# Patient Record
Sex: Female | Born: 1995 | Race: White | Hispanic: Yes | Marital: Single | Smoking: Never smoker
Health system: Southern US, Community
[De-identification: ages and names within clinical notes are randomized; demographics above are authoritative.]

## PROBLEM LIST (undated history)

## (undated) DIAGNOSIS — G43909 Migraine, unspecified, not intractable, without status migrainosus: Secondary | ICD-10-CM

---

## 2015-11-15 ENCOUNTER — Encounter (HOSPITAL_COMMUNITY): Payer: Self-pay | Admitting: *Deleted

## 2015-11-15 ENCOUNTER — Emergency Department (HOSPITAL_COMMUNITY)
Admission: EM | Admit: 2015-11-15 | Discharge: 2015-11-16 | Disposition: A | Discharge status: Home / Self Care | Payer: Self-pay | Attending: Emergency Medicine | Admitting: Emergency Medicine

## 2015-11-15 DIAGNOSIS — R51 Headache: Secondary | ICD-10-CM | POA: Insufficient documentation

## 2015-11-15 DIAGNOSIS — H109 Unspecified conjunctivitis: Secondary | ICD-10-CM | POA: Insufficient documentation

## 2015-11-15 MED ORDER — TETRACAINE HCL 0.5 % OP SOLN
OPHTHALMIC | Status: AC
  Administered 2015-11-16
  Filled 2015-11-15: qty 4

## 2015-11-15 MED ORDER — FLUORESCEIN SODIUM 1 MG OP STRP
ORAL_STRIP | OPHTHALMIC | Status: AC
  Administered 2015-11-16
  Filled 2015-11-15: qty 1

## 2015-11-15 NOTE — ED Notes (Signed)
Pt reports feeling like something is in her left eye.

## 2015-11-15 NOTE — ED Provider Notes (Signed)
CSN: [id number]     Arrival date & time 11/15/15  2300 History   First MD Initiated Contact with Patient 11/15/15 2326     Chief Complaint  Patient presents with  . Eye Pain     (Consider location/radiation/quality/duration/timing/severity/associated sxs/prior Treatment) Patient is a 20 y.o. female presenting with eye pain. The history is provided by the patient.  Eye Pain This is a new problem. The current episode started today. The problem occurs intermittently. The problem has been gradually worsening. Associated symptoms include headaches. Pertinent negatives include no chills, fever, numbness, vomiting or weakness. Exacerbated by: bright lights. Treatments tried: flushing with water. The treatment provided no relief.    History reviewed. No pertinent past medical history. History reviewed. No pertinent past surgical history. No family history on file. Social History  Substance Use Topics  . Smoking status: Never Smoker   . Smokeless tobacco: None  . Alcohol Use: No   OB History    No data available     Review of Systems  Constitutional: Negative for fever and chills.  Eyes: Positive for photophobia, pain and redness.  Gastrointestinal: Negative for vomiting.  Neurological: Positive for headaches. Negative for weakness and numbness.  All other systems reviewed and are negative.     Allergies  Review of patient's allergies indicates no known allergies.  Home Medications   Prior to Admission medications   Not on File   BP 117/74 mmHg  Pulse 75  Temp(Src) 98.1 F (36.7 C) (Oral)  Resp 18  Ht 5\' 6"  (1.676 m)  Wt 72.576 kg  BMI 25.84 kg/m2  SpO2 100%  LMP 10/21/2015 Physical Exam  Constitutional: She is oriented to person, place, and time. She appears well-developed and well-nourished.  Non-toxic appearance.  HENT:  Head: Normocephalic.  Right Ear: Tympanic membrane and external ear normal.  Left Ear: Tympanic membrane and external ear normal.  Eyes: EOM  and lids are normal. Pupils are equal, round, and reactive to light. Right eye exhibits no chemosis, no discharge and no hordeolum. No foreign body present in the right eye. Left eye exhibits no chemosis, no discharge and no hordeolum. No foreign body present in the left eye. Left conjunctiva is injected. No scleral icterus.  Fundoscopic exam:      The right eye shows no AV nicking, no exudate, no hemorrhage and no papilledema.       The left eye shows no AV nicking, no exudate, no hemorrhage and no papilledema.  Slit lamp exam:      The left eye shows no corneal abrasion, no corneal ulcer, no foreign body, no hyphema and no fluorescein uptake.  Neck: Normal range of motion. Neck supple. Carotid bruit is not present.  Cardiovascular: Normal rate, regular rhythm, normal heart sounds, intact distal pulses and normal pulses.   Pulmonary/Chest: Breath sounds normal. No respiratory distress.  Abdominal: Soft. Bowel sounds are normal. There is no tenderness. There is no guarding.  Musculoskeletal: Normal range of motion.  Lymphadenopathy:       Head (right side): No submandibular adenopathy present.       Head (left side): No submandibular adenopathy present.    She has no cervical adenopathy.  Neurological: She is alert and oriented to person, place, and time. She has normal strength. No cranial nerve deficit or sensory deficit.  Skin: Skin is warm and dry.  Psychiatric: She has a normal mood and affect. Her speech is normal.  Nursing note and vitals reviewed.   ED Course  Procedures (including critical care time) Labs Review Labs Reviewed - No data to display  Imaging Review No results found. I have personally reviewed and evaluated these images and lab results as part of my medical decision-making.   EKG Interpretation None      MDM  Patient examined. The examination favors a conjunctivitis involving the left eye. No foreign body appreciated. No corneal abrasion, or stye  appreciated. Patient will be treated with cool compresses and tobramycin ophthalmic drops. Patient is to follow with ophthalmology, or return to the emergency department if any changes or problems.    Final diagnoses:  Conjunctivitis of left eye    *I have reviewed nursing notes, vital signs, and all appropriate lab and imaging results for this patient.[address] PA-C 11/17/15 1540  Shon Baton, MD 11/17/15 2253

## 2015-11-16 MED ORDER — HYDROCODONE-ACETAMINOPHEN 5-325 MG PO TABS
1.0000 | ORAL_TABLET | Freq: Once | ORAL | Status: AC
  Administered 2015-11-16: 1 via ORAL
  Filled 2015-11-16: qty 1

## 2015-11-16 MED ORDER — TOBRAMYCIN 0.3 % OP SOLN
2.0000 [drp] | Freq: Once | OPHTHALMIC | Status: AC
  Administered 2015-11-16: 2 [drp] via OPHTHALMIC
  Filled 2015-11-16: qty 5

## 2015-11-16 MED ORDER — ONDANSETRON HCL 4 MG PO TABS
4.0000 mg | ORAL_TABLET | Freq: Once | ORAL | Status: AC
  Administered 2015-11-16: 4 mg via ORAL
  Filled 2015-11-16: qty 1

## 2015-11-16 MED ORDER — HYDROCODONE-ACETAMINOPHEN 5-325 MG PO TABS
1.0000 | ORAL_TABLET | ORAL | Status: AC | PRN
Start: 2015-11-16 — End: ?

## 2015-11-16 NOTE — ED Notes (Signed)
Pt alert & oriented x4, stable gait. Patient given discharge instructions, paperwork & prescription(s). Patient informed not to drive, operate any equipment & handel any important documents 4 hours after taking pain medication. Patient  instructed to stop at the registration desk to finish any additional paperwork. Patient  verbalized understanding. Pt left department w/ no further questions. 

## 2015-11-16 NOTE — Discharge Instructions (Signed)
Your examination is consistent with conjunctivitis/pink eye. Please use cool compresses to your eye. Please wash hands frequently. Change your pillowcases daily. Use dark glasses and a hat with brim when in a brightly lit rooms, or going inside and outside. Please see your eye specialist if not improving. Use Tylenol or ibuprofen for mild pain. Use Norco for more severe pain or headache.

## 2015-11-24 ENCOUNTER — Other Ambulatory Visit (HOSPITAL_COMMUNITY): Payer: Self-pay | Admitting: *Deleted

## 2015-11-24 DIAGNOSIS — R229 Localized swelling, mass and lump, unspecified: Principal | ICD-10-CM

## 2015-11-24 DIAGNOSIS — IMO0002 Reserved for concepts with insufficient information to code with codable children: Secondary | ICD-10-CM

## 2015-12-01 ENCOUNTER — Ambulatory Visit (HOSPITAL_COMMUNITY): Admission: RE | Admit: 2015-12-01 | Payer: Self-pay | Source: Ambulatory Visit

## 2015-12-01 ENCOUNTER — Ambulatory Visit (HOSPITAL_COMMUNITY)
Admission: RE | Admit: 2015-12-01 | Discharge: 2015-12-01 | Disposition: A | Discharge status: Home / Self Care | Payer: PRIVATE HEALTH INSURANCE | Source: Ambulatory Visit | Attending: *Deleted | Admitting: *Deleted

## 2015-12-01 ENCOUNTER — Other Ambulatory Visit (HOSPITAL_COMMUNITY): Payer: Self-pay

## 2015-12-01 DIAGNOSIS — N63 Unspecified lump in breast: Secondary | ICD-10-CM | POA: Diagnosis present

## 2015-12-01 DIAGNOSIS — R229 Localized swelling, mass and lump, unspecified: Secondary | ICD-10-CM

## 2015-12-01 DIAGNOSIS — IMO0002 Reserved for concepts with insufficient information to code with codable children: Secondary | ICD-10-CM

## 2016-06-08 ENCOUNTER — Other Ambulatory Visit (HOSPITAL_COMMUNITY): Payer: Self-pay | Admitting: *Deleted

## 2016-06-08 DIAGNOSIS — Z09 Encounter for follow-up examination after completed treatment for conditions other than malignant neoplasm: Secondary | ICD-10-CM

## 2016-06-14 ENCOUNTER — Ambulatory Visit (HOSPITAL_COMMUNITY)
Admission: RE | Admit: 2016-06-14 | Discharge: 2016-06-14 | Disposition: A | Discharge status: Home / Self Care | Payer: PRIVATE HEALTH INSURANCE | Source: Ambulatory Visit | Attending: *Deleted | Admitting: *Deleted

## 2016-06-14 DIAGNOSIS — Z09 Encounter for follow-up examination after completed treatment for conditions other than malignant neoplasm: Secondary | ICD-10-CM | POA: Diagnosis present

## 2016-07-22 ENCOUNTER — Encounter (HOSPITAL_COMMUNITY): Payer: Self-pay | Admitting: Emergency Medicine

## 2016-07-22 ENCOUNTER — Emergency Department (HOSPITAL_COMMUNITY)
Admission: EM | Admit: 2016-07-22 | Discharge: 2016-07-22 | Disposition: A | Discharge status: Home / Self Care | Payer: Self-pay | Attending: Emergency Medicine | Admitting: Emergency Medicine

## 2016-07-22 DIAGNOSIS — N3001 Acute cystitis with hematuria: Secondary | ICD-10-CM | POA: Insufficient documentation

## 2016-07-22 LAB — URINALYSIS, ROUTINE W REFLEX MICROSCOPIC
BILIRUBIN URINE: NEGATIVE
GLUCOSE, UA: NEGATIVE mg/dL
Ketones, ur: NEGATIVE mg/dL
Nitrite: NEGATIVE
PH: 6 (ref 5.0–8.0)
Protein, ur: NEGATIVE mg/dL
SPECIFIC GRAVITY, URINE: 1.01 (ref 1.005–1.030)

## 2016-07-22 LAB — URINE MICROSCOPIC-ADD ON

## 2016-07-22 LAB — PREGNANCY, URINE: PREG TEST UR: NEGATIVE

## 2016-07-22 MED ORDER — CEPHALEXIN 500 MG PO CAPS: 500.0000 mg | ORAL_CAPSULE | Freq: Four times a day (QID) | ORAL | 0 refills | Status: AC

## 2016-07-22 MED ORDER — IBUPROFEN 600 MG PO TABS
600.0000 mg | ORAL_TABLET | Freq: Four times a day (QID) | ORAL | 0 refills | Status: AC | PRN
Start: 2016-07-22 — End: ?

## 2016-07-22 MED ORDER — PHENAZOPYRIDINE HCL 200 MG PO TABS: 200.0000 mg | ORAL_TABLET | Freq: Three times a day (TID) | ORAL | 0 refills | Status: AC

## 2016-07-22 MED ORDER — PHENAZOPYRIDINE HCL 100 MG PO TABS
200.0000 mg | ORAL_TABLET | Freq: Once | ORAL | Status: AC
  Administered 2016-07-22: 200 mg via ORAL
  Filled 2016-07-22: qty 2

## 2016-07-22 MED ORDER — CEPHALEXIN 500 MG PO CAPS
500.0000 mg | ORAL_CAPSULE | Freq: Once | ORAL | Status: AC
  Administered 2016-07-22: 500 mg via ORAL
  Filled 2016-07-22: qty 1

## 2016-07-22 NOTE — ED Notes (Signed)
Pt alert & oriented x4, stable gait. Patient given discharge instructions, paperwork & prescription(s). Registration completed in room.  Patient verbalized understanding. Pt left department w/ no further questions. 

## 2016-07-22 NOTE — ED Notes (Signed)
Pt says lower back pain that started this morning. Pt also having increase in frequency & pain w/ urination.

## 2016-07-22 NOTE — ED Triage Notes (Signed)
Pt reports burning with urination, hematuria and low back pain.Onset of symptoms 2 days ago.

## 2016-07-23 NOTE — ED Provider Notes (Signed)
[facility]-EM[facility]Y DEPT Provider Note   CSN: [id number] Arrival date & time: 07/22/16  1651     History   Chief Complaint Chief Complaint  Patient presents with  . Back Pain    HPI Carrie Ortiz is a 20 y.o. female.  The history is provided by the patient.  Back Pain   This is a new problem. The current episode started 12 to 24 hours ago. The problem occurs constantly. The problem has been gradually worsening. The pain is associated with no known injury. Pain location: bilateral lower back. The quality of the pain is described as aching. The pain does not radiate. The pain is moderate. The pain is the same all the time. Associated symptoms include dysuria. Pertinent negatives include no chest pain, no fever, no numbness, no headaches, no abdominal pain, no abdominal swelling, no bowel incontinence, no perianal numbness, no bladder incontinence, no paresthesias and no weakness. Associated symptoms comments: Dysuria started 2 days ago. She has tried nothing for the symptoms.    History reviewed. No pertinent past medical history.  There are no active problems to display for this patient.   History reviewed. No pertinent surgical history.  OB History    No data available       Home Medications    Prior to Admission medications   Medication Sig Start Date End Date Taking? Authorizing Provider  cephALEXin (KEFLEX) 500 MG capsule Take 1 capsule (500 mg total) by mouth 4 (four) times daily. 07/22/16   Burgess AmorJulie Lauris Keepers, PA-C  HYDROcodone-acetaminophen (NORCO/VICODIN) 5-325 MG tablet Take 1 tablet by mouth every 4 (four) hours as needed. 11/16/15   Ivery QualeHobson Bryant, PA-C  ibuprofen (ADVIL,MOTRIN) 600 MG tablet Take 1 tablet (600 mg total) by mouth every 6 (six) hours as needed. 07/22/16   Burgess AmorJulie Joseeduardo Brix, PA-C  phenazopyridine (PYRIDIUM) 200 MG tablet Take 1 tablet (200 mg total) by mouth 3 (three) times daily. 07/22/16   Burgess AmorJulie Trameka Dorough, PA-C    Family History Family History  Problem Relation  Age of Onset  . Migraines Mother   . Migraines Brother   . Migraines Other     Social History Social History  Substance Use Topics  . Smoking status: Never Smoker  . Smokeless tobacco: Never Used  . Alcohol use No     Allergies   Patient has no known allergies.   Review of Systems Review of Systems  Constitutional: Negative for chills and fever.  HENT: Negative for congestion and sore throat.   Eyes: Negative.   Respiratory: Negative for chest tightness and shortness of breath.   Cardiovascular: Negative for chest pain.  Gastrointestinal: Negative for abdominal pain, bowel incontinence and nausea.  Genitourinary: Positive for dysuria, frequency, hematuria and urgency. Negative for bladder incontinence and vaginal discharge.  Musculoskeletal: Positive for back pain. Negative for arthralgias, joint swelling and neck pain.  Skin: Negative.  Negative for rash and wound.  Neurological: Negative for dizziness, weakness, light-headedness, numbness, headaches and paresthesias.  Psychiatric/Behavioral: Negative.      Physical Exam Updated Vital Signs BP 114/78 (BP Location: Right Arm)   Pulse 78   Temp 98 F (36.7 C) (Oral)   Resp 18   Ht 5\' 7"  (1.702 m)   Wt 63.5 kg   LMP 06/30/2016   SpO2 100%   BMI 21.93 kg/m   Physical Exam  Constitutional: She appears well-developed and well-nourished.  HENT:  Head: Normocephalic and atraumatic.  Eyes: Conjunctivae are normal.  Neck: Normal range of motion.  Cardiovascular: Normal  rate, regular rhythm, normal heart sounds and intact distal pulses.   Pulmonary/Chest: Effort normal and breath sounds normal. She has no wheezes.  Abdominal: Soft. Bowel sounds are normal. She exhibits no distension and no mass. There is tenderness in the suprapubic area. There is no guarding and no CVA tenderness.  Mild discomfort with suprapubic palpation  Musculoskeletal: Normal range of motion.  Neurological: She is alert.  Skin: Skin is warm  and dry.  Psychiatric: She has a normal mood and affect.  Nursing note and vitals reviewed.    ED Treatments / Results  Labs (all labs ordered are listed, but only abnormal results are displayed) Labs Reviewed  URINALYSIS, ROUTINE W REFLEX MICROSCOPIC (NOT AT [facility]) - Abnormal; Notable for the following:       Result Value   Hgb urine dipstick MODERATE (*)    Leukocytes, UA SMALL (*)    All other components within normal limits  URINE MICROSCOPIC-ADD ON - Abnormal; Notable for the following:    Squamous Epithelial / LPF 0-5 (*)    Bacteria, UA MANY (*)    All other components within normal limits  URINE CULTURE  PREGNANCY, URINE    EKG  EKG Interpretation None       Radiology No results found.  Procedures Procedures (including critical care time)  Medications Ordered in ED Medications  cephALEXin (KEFLEX) capsule 500 mg (500 mg Oral Given 07/22/16 2036)  phenazopyridine (PYRIDIUM) tablet 200 mg (200 mg Oral Given 07/22/16 2036)     Initial Impression / Assessment and Plan / ED Course  I have reviewed the triage vital signs and the nursing notes.  Pertinent labs & imaging results that were available during my care of the patient were reviewed by me and considered in my medical decision making (see chart for details).  Clinical Course     Cultures ordered, pt with uti, keflex, ibuprofen, pyridium.  Rest, increased fluids. Recheck lab visit with pcp after abx completed to ensure resolution. Discussed sx requiring urgent recheck.   The patient appears reasonably screened and/or stabilized for discharge and I doubt any other medical condition or other [facility] requiring further screening, evaluation, or treatment in the ED at this time prior to discharge.   Final Clinical Impressions(s) / ED Diagnoses   Final diagnoses:  Acute cystitis with hematuria    New Prescriptions Discharge Medication List as of 07/22/2016  8:39 PM    START taking these medications    Details  cephALEXin (KEFLEX) 500 MG capsule Take 1 capsule (500 mg total) by mouth 4 (four) times daily., Starting Fri 07/22/2016, Print    ibuprofen (ADVIL,MOTRIN) 600 MG tablet Take 1 tablet (600 mg total) by mouth every 6 (six) hours as needed., Starting Fri 07/22/2016, Print    phenazopyridine (PYRIDIUM) 200 MG tablet Take 1 tablet (200 mg total) by mouth 3 (three) times daily., Starting Fri 07/22/2016, Print         Burgess AmorJulie Ottilia Pippenger, PA-C 07/23/16 1320    Donnetta HutchingBrian Cook, MD 07/23/16 1550

## 2016-07-26 LAB — URINE CULTURE

## 2016-07-27 NOTE — Progress Notes (Signed)
ED Antimicrobial Stewardship Positive Culture Follow Up   Carrie Ortiz is an 20 y.o. female who [facility] with a chief complaint of  Chief Complaint  Patient presents with  . Back Pain    Recent Results (from the past 720 hour(s))  Urine culture     Status: Abnormal   Collection Time: 07/22/16  8:32 PM  Result Value Ref Range Status   Specimen Description URINE, CLEAN CATCH  Final   Special Requests NONE  Final   Culture (A)  Final    >=100,000 COLONIES/mL STAPHYLOCOCCUS SPECIES (COAGULASE NEGATIVE)   Report Status 07/26/2016 FINAL  Final   Organism ID, Bacteria STAPHYLOCOCCUS SPECIES (COAGULASE NEGATIVE) (A)  Final      Susceptibility   Staphylococcus species (coagulase negative) - MIC*    CIPROFLOXACIN <=0.5 SENSITIVE Sensitive     GENTAMICIN <=0.5 SENSITIVE Sensitive     NITROFURANTOIN <=16 SENSITIVE Sensitive     OXACILLIN 2 RESISTANT Resistant     TETRACYCLINE >=16 RESISTANT Resistant     VANCOMYCIN <=0.5 SENSITIVE Sensitive     TRIMETH/SULFA <=10 SENSITIVE Sensitive     CLINDAMYCIN <=0.25 RESISTANT Resistant     RIFAMPIN <=0.5 SENSITIVE Sensitive     Inducible Clindamycin POSITIVE Resistant     * >=100,000 COLONIES/mL STAPHYLOCOCCUS SPECIES (COAGULASE NEGATIVE)    [x]  Treated with kelfex, organism resistant to prescribed antimicrobial  New antibiotic prescription: Nitrofurantoin 100 mg PO BID x 5 days  ED Provider: Arthor CaptainAbigail Harris, PA-C  Allena Katzaroline E Welles, Pharm.D. PGY1 Pharmacy Resident 11/15/20178:59 AM Pager [phone] Phone# [phone]

## 2016-07-28 ENCOUNTER — Telehealth (HOSPITAL_BASED_OUTPATIENT_CLINIC_OR_DEPARTMENT_OTHER): Payer: Self-pay | Admitting: Emergency Medicine

## 2016-07-28 NOTE — Telephone Encounter (Signed)
Post ED Visit - Positive Culture Follow-up: Successful Patient Follow-Up  Culture assessed and recommendations reviewed by: []  Enzo BiNathan Batchelder, Pharm.D. []  Celedonio MiyamotoJeremy Frens, Pharm.D., BCPS []  Garvin FilaMike Maccia, Pharm.D. []  Georgina PillionElizabeth Martin, Pharm.D., BCPS []  McNeilMinh Pham, [address]D., BCPS, AAHIVP []  Estella HuskMichelle Turner, Pharm.D., BCPS, AAHIVP []  Tennis Mustassie Stewart, Pharm.D. []  Rob Oswaldo DoneVincent, [address]Reina Fuse[address] C. Welles PharmD  Positive urine culture  []  Patient discharged without antimicrobial prescription and treatment is now indicated [x]  Organism is resistant to prescribed ED discharge antimicrobial []  Patient with positive blood cultures  Changes discussed with ED provider: Arthor CaptainAbigail Harris PA New antibiotic prescription d/c keflex, start nitrofurantoin 100mg  po bid x 5 days [facility]  Contacted patient, 07/28/16 1515   Berle MullMiller, Earline Stiner 07/28/2016, 3:18 PM

## 2016-07-29 ENCOUNTER — Telehealth (HOSPITAL_COMMUNITY): Payer: Self-pay

## 2016-07-29 NOTE — Telephone Encounter (Signed)
Pt calling states went to pharmacy and they didn't have [facility].  [facility] perscription but was [facility] and pt made aware to check under both names.

## 2017-09-15 IMAGING — US US BREAST LTD UNI LEFT INC AXILLA
1 series · 2 of 2 positions shown · non-contrast
Comparison: None.

CLINICAL DATA: 20-year-old female with a palpable abnormality
involving the upper inner quadrant of the left breast. The patient
denies any right breast palpable abnormalities.

EXAM:
ULTRASOUND OF THE LEFT BREAST

[Series 1: us breast ltd uni left inc axilla · 0.07mm/px · 2 of 2 slices shown]
[im 1/2]
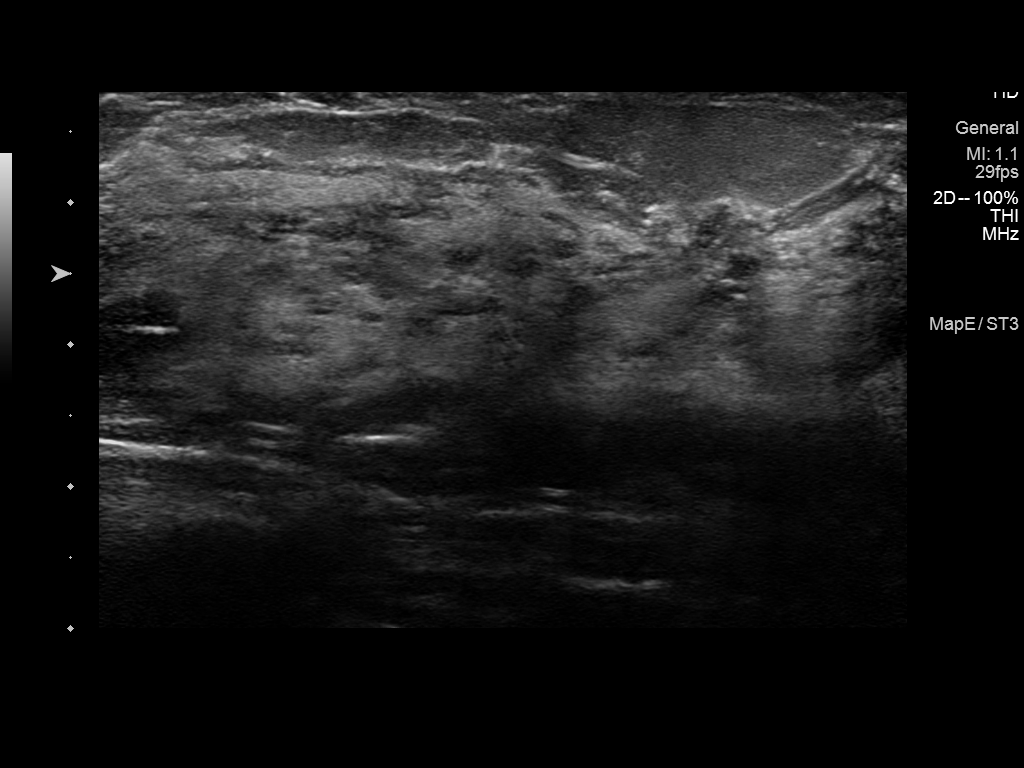
[im 2/2]
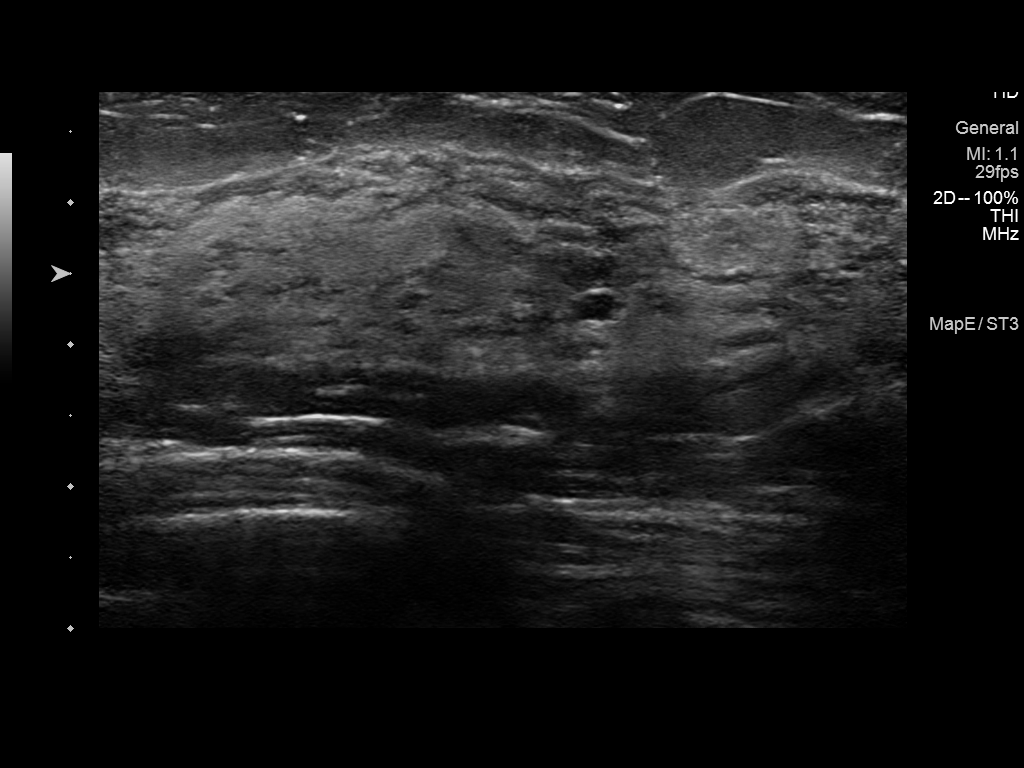

[2 of 2 positions shown; findings below may reference images not displayed]

FINDINGS: Physical examination of the upper inner left breast reveals a
generalized area of thickening without a discrete underlying
palpable mass. The patient is tender to palpation specifically in
the upper inner left breast from the approximate 9 to 11 o'clock
position.

Targeted ultrasound of the left breast was performed. No discrete
masses or abnormalities are seen, only extremely dense
fibroglandular tissue is visualized. The entire upper inner left
breast was scanned.
IMPRESSION: No sonographic correlate for the palpable abnormality and pain
involving the upper inner quadrant of the left breast.

RECOMMENDATION:
1. Recommend further management of the left breast palpable and
painful abnormality be based on clinical assessment.

2. Screening mammogram at age 38 unless there are persistent or
intervening clinical concerns. (Code:[id number]-[id number])

I have discussed the findings and recommendations with the patient.
Results were also provided in writing at the conclusion of the
visit. If applicable, a reminder letter will be sent to the patient
regarding the next appointment.

BI-RADS CATEGORY  1: Negative.

## 2020-02-15 ENCOUNTER — Other Ambulatory Visit: Payer: Self-pay

## 2020-02-15 ENCOUNTER — Emergency Department (HOSPITAL_COMMUNITY)
Admission: EM | Admit: 2020-02-15 | Discharge: 2020-02-15 | Disposition: A | Discharge status: Home / Self Care | Payer: BC Managed Care – PPO | Attending: Emergency Medicine | Admitting: Emergency Medicine

## 2020-02-15 ENCOUNTER — Encounter (HOSPITAL_COMMUNITY): Payer: Self-pay

## 2020-02-15 DIAGNOSIS — G501 Atypical facial pain: Secondary | ICD-10-CM | POA: Diagnosis present

## 2020-02-15 DIAGNOSIS — R05 Cough: Secondary | ICD-10-CM | POA: Insufficient documentation

## 2020-02-15 DIAGNOSIS — J0100 Acute maxillary sinusitis, unspecified: Secondary | ICD-10-CM | POA: Diagnosis not present

## 2020-02-15 DIAGNOSIS — R0981 Nasal congestion: Secondary | ICD-10-CM | POA: Insufficient documentation

## 2020-02-15 DIAGNOSIS — M791 Myalgia, unspecified site: Secondary | ICD-10-CM | POA: Diagnosis not present

## 2020-02-15 HISTORY — DX: Migraine, unspecified, not intractable, without status migrainosus: G43.909

## 2020-02-15 MED ORDER — MECLIZINE HCL 12.5 MG PO TABS: 12.5000 mg | ORAL_TABLET | Freq: Three times a day (TID) | ORAL | 0 refills | Status: AC | PRN

## 2020-02-15 MED ORDER — AZITHROMYCIN 250 MG PO TABS: 250.0000 mg | ORAL_TABLET | Freq: Every day | ORAL | 0 refills | Status: AC

## 2020-02-15 MED ORDER — FLUTICASONE PROPIONATE 50 MCG/ACT NA SUSP: 2.0000 | Freq: Every day | NASAL | 0 refills | Status: AC

## 2020-02-15 NOTE — ED Provider Notes (Signed)
Emergency Department Provider Note   I have reviewed the triage vital signs and the nursing notes.   HISTORY  Chief Complaint Facial Pain   HPI Carrie Ortiz is a 24 y.o. female with PMH of migraine presents to the emergency department for evaluation of face pain with body aches, congestion.  Patient has been up-to-date on Covid vaccines and gets daily temperature screenings at work and has not been febrile.  She denies sore throat.  She has some mild, slightly productive cough but no shortness of breath or chest pain.  Denies abdominal pain, vomiting, diarrhea.  No dysuria, hesitancy, urgency.  She feels some dizziness like occasional gentle movement or spinning.  No ear ringing. No decreased hearing. No radiation of symptoms or modifying factors.    Past Medical History:  Diagnosis Date  . Migraine     There are no problems to display for this patient.   History reviewed. No pertinent surgical history.  Allergies Patient has no known allergies.  Family History  Problem Relation Age of Onset  . Migraines Mother   . Migraines Brother   . Migraines Other     Social History Social History   Tobacco Use  . Smoking status: Never Smoker  . Smokeless tobacco: Never Used  Substance Use Topics  . Alcohol use: No  . Drug use: No    Review of Systems  Constitutional: No fever/chills. Positive body aches.  Eyes: No visual changes. ENT: No sore throat. Face pain and nasal congestion.  Cardiovascular: Denies chest pain. Respiratory: Denies shortness of breath. Mild cough.  Gastrointestinal: No abdominal pain.  No nausea, no vomiting.  No diarrhea.  No constipation. Genitourinary: Negative for dysuria. Musculoskeletal: Negative for back pain. Skin: Negative for rash. Neurological: Negative for headaches, focal weakness or numbness.  10-point ROS otherwise negative.  ____________________________________________   PHYSICAL EXAM:  VITAL SIGNS: ED Triage  Vitals  Enc Vitals Group     BP 02/15/20 0725 125/78     Pulse Rate 02/15/20 0725 95     Resp 02/15/20 0725 18     Temp 02/15/20 0725 98 F (36.7 C)     Temp Source 02/15/20 0725 Oral     SpO2 02/15/20 0725 96 %     Weight 02/15/20 0723 160 lb (72.6 kg)     Height 02/15/20 0723 5\' 6"  (1.676 m)   Constitutional: Alert and oriented. Well appearing and in no acute distress. Eyes: Conjunctivae are normal. Head: Atraumatic. Nose: No congestion/rhinnorhea. Mouth/Throat: Mucous membranes are moist.  Oropharynx non-erythematous. No exudate. No PTA. Clear voice and managing oral secretions.  Neck: No stridor.   Cardiovascular: Normal rate, regular rhythm. Good peripheral circulation. Grossly normal heart sounds.   Respiratory: Normal respiratory effort.  No retractions. Lungs CTAB. Gastrointestinal: No distention.  Musculoskeletal: No gross deformities of extremities. Neurologic:  Normal speech and language.  Skin:  Skin is warm, dry and intact. No rash noted.  ____________________________________________   PROCEDURES  Procedure(s) performed:   Procedures  None  ____________________________________________   INITIAL IMPRESSION / ASSESSMENT AND PLAN / ED COURSE  Pertinent labs & imaging results that were available during my care of the patient were reviewed by me and considered in my medical decision making (see chart for details).   Patient presents to the emergency department for evaluation of face pain and nasal congestion.  Seems most consistent with sinus type infection with mild vertigo related to that.  No concern clinically for strep pharyngitis or deeper space  neck infection.  Lungs are clear to auscultation bilaterally with normal oxygen saturation.  No indication for chest imaging at this time.  Patient is up-to-date on Covid vaccine.  Plan for azithromycin, Flomax, meclizine and close PCP follow-up.    ____________________________________________  FINAL CLINICAL  IMPRESSION(S) / ED DIAGNOSES  Final diagnoses:  Acute non-recurrent maxillary sinusitis    NEW OUTPATIENT MEDICATIONS STARTED DURING THIS VISIT:  New Prescriptions   AZITHROMYCIN (ZITHROMAX) 250 MG TABLET    Take 1 tablet (250 mg total) by mouth daily. Take first 2 tablets together, then 1 every day until finished.   FLUTICASONE (FLONASE) 50 MCG/ACT NASAL SPRAY    Place 2 sprays into both nostrils daily for 14 days.   MECLIZINE (ANTIVERT) 12.5 MG TABLET    Take 1 tablet (12.5 mg total) by mouth 3 (three) times daily as needed for dizziness.    Note:  This document was prepared using Dragon voice recognition software and may include unintentional dictation errors.  [name], MD, [facility] Emergency Medicine    Torrey Horseman, [facility], MD 02/15/20 [phone]

## 2020-02-15 NOTE — ED Triage Notes (Signed)
Pt reports generalized body aches, facial pain, and headache x 1 week.  Denies fever.  Reports has had both covid vaccines.

## 2020-02-15 NOTE — Discharge Instructions (Signed)
You were seen in emergency department today with body aches and facial pain.  I am starting antibiotics as well as a nasal spray to use as directed.  You can take the meclizine as needed for dizziness.  Please follow closely with your primary care doctor.  If you do not have one, I have listed a local practice you can call for an appointment.  Please return to the emergency department with any new or suddenly worsening symptoms.
